# Patient Record
Sex: Female | Born: 1965 | Race: White | Hispanic: No | Marital: Married
Health system: Southern US, Community
[De-identification: ages and names within clinical notes are randomized; demographics above are authoritative.]

---

## 2005-05-15 ENCOUNTER — Ambulatory Visit: Payer: Self-pay | Admitting: Obstetrics and Gynecology

## 2006-03-02 ENCOUNTER — Observation Stay: Payer: Self-pay

## 2006-03-03 ENCOUNTER — Inpatient Hospital Stay: Payer: Self-pay

## 2007-09-30 ENCOUNTER — Ambulatory Visit: Payer: Self-pay

## 2012-10-31 ENCOUNTER — Ambulatory Visit: Payer: Self-pay

## 2012-11-11 ENCOUNTER — Ambulatory Visit: Payer: Self-pay

## 2013-12-14 IMAGING — MG MM ADDITIONAL VIEWS AT NO CHARGE
1 series · 2 of 2 positions shown · non-contrast
Comparison: 10/31/2012 and earlier

CLINICAL DATA: The patient presents for further evaluation of the
left breast.

EXAM:
DIGITAL DIAGNOSTIC  left MAMMOGRAM WITH CAD
ULTRASOUND left BREAST

[L ML · left · 2 of 2 slices shown]
[im 1/2]
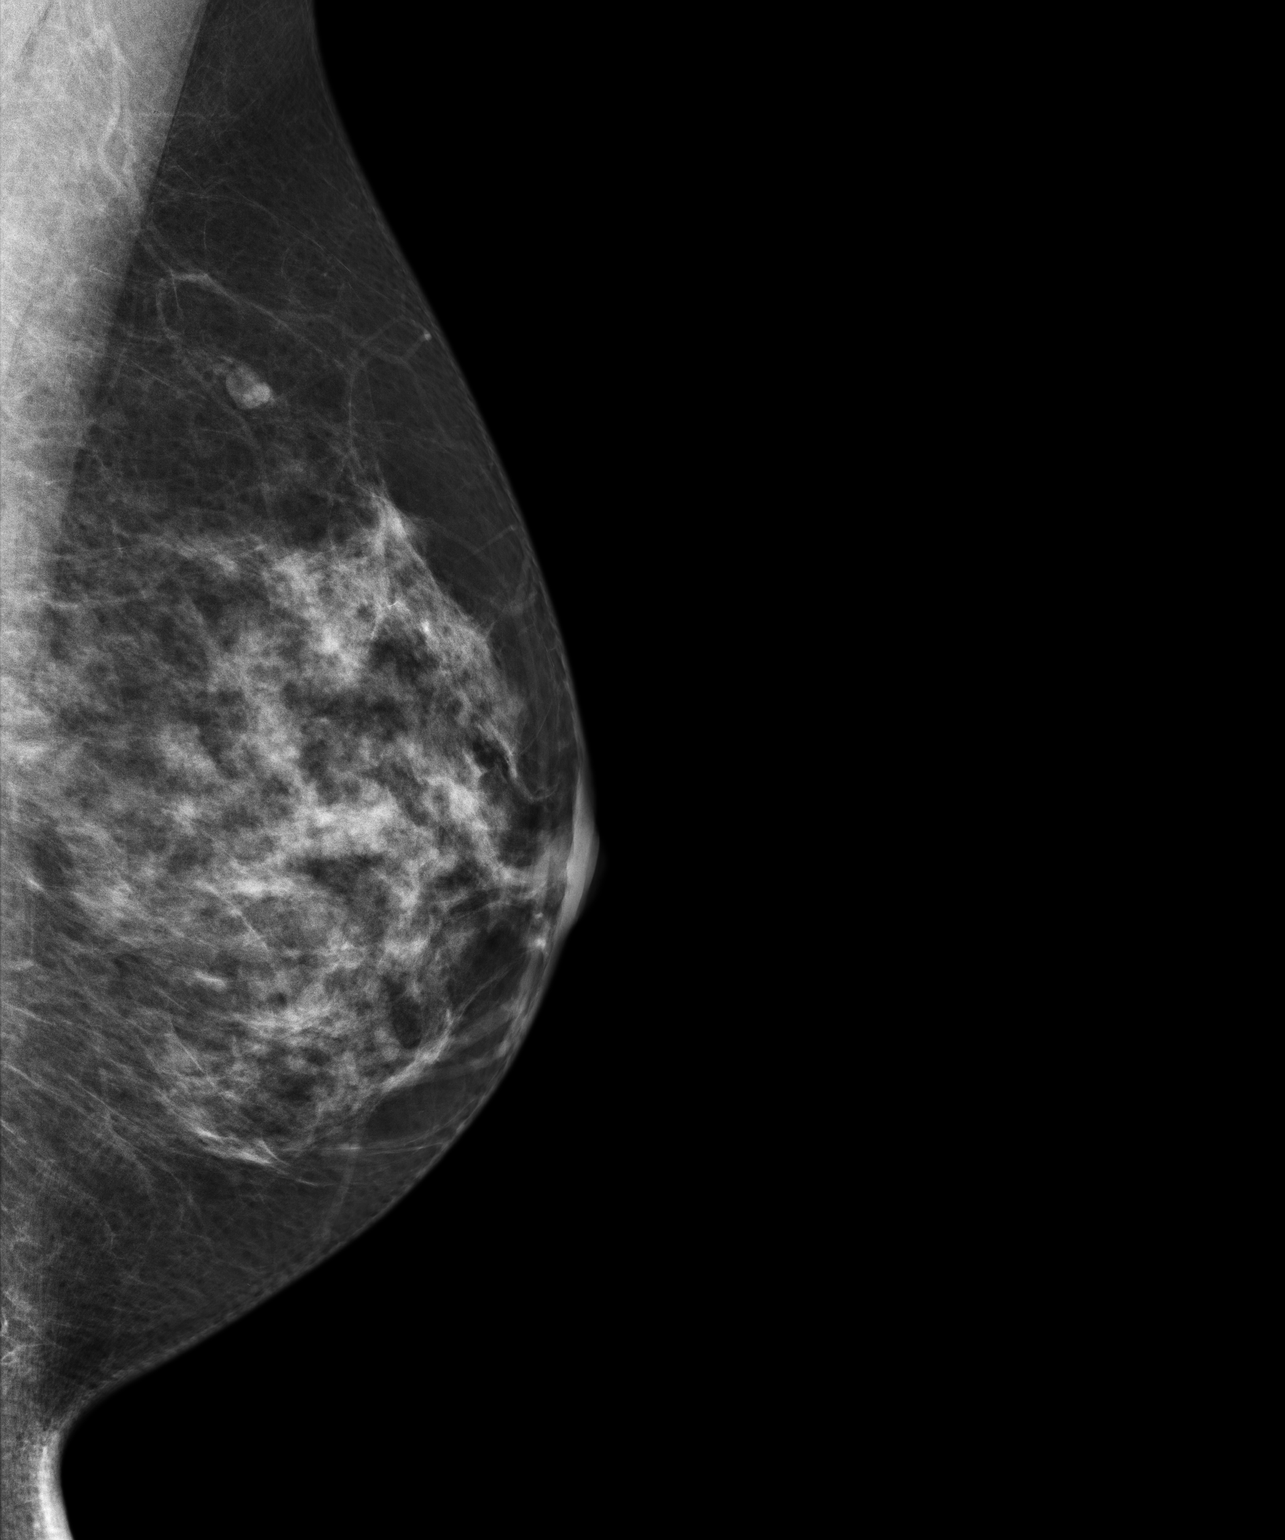
[im 2/2]
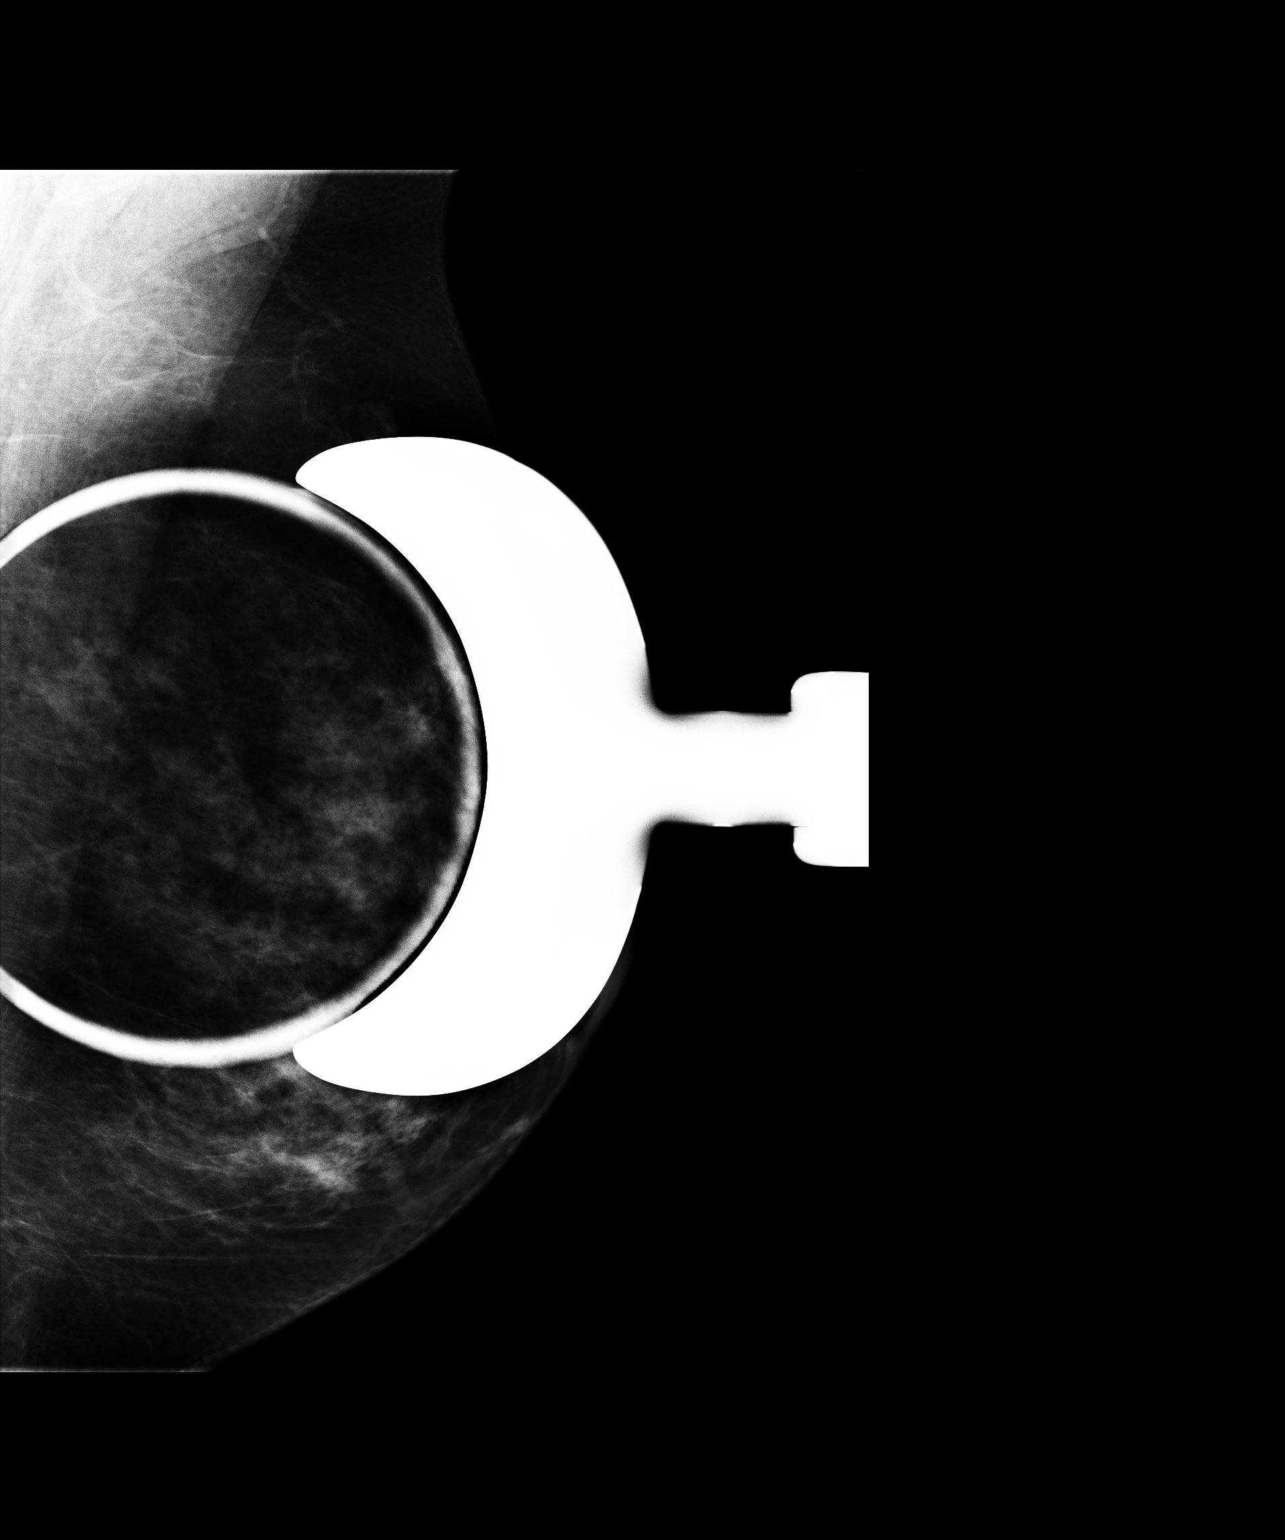

[2 of 2 positions shown; findings below may reference images not displayed]

ACR Breast Density Category c: The breasts are heterogeneously
dense, which may obscure small masses.
FINDINGS: Additional views are performed, showing no persistent abnormality in
the upper portion of the left breast.

Mammographic images were processed with CAD.

On physical exam,I palpate no abnormality in the upper quadrants of
the left breast..

Ultrasound is performed, showing normal appearing, dense
fibroglandular tissue throughout the upper quadrants on the left. No
mass, distortion, or acoustic shadowing is demonstrated with
ultrasound. .
IMPRESSION: 1.  No mammographic or ultrasound evidence for malignancy.
2. No persistent abnormality in the left breast on further
evaluation.

RECOMMENDATION:
Screening mammogram in one year.(Code:[id number]-Q-7T9)

I have discussed the findings and recommendations with the patient.
Results were also provided in writing at the conclusion of the
visit.

BI-RADS CATEGORY  1: Negative

## 2017-11-17 ENCOUNTER — Ambulatory Visit
Admission: RE | Admit: 2017-11-17 | Discharge: 2017-11-17 | Disposition: A | Discharge status: Home / Self Care | Payer: Self-pay | Source: Ambulatory Visit | Attending: Oncology | Admitting: Oncology

## 2017-11-17 ENCOUNTER — Encounter: Payer: Self-pay | Admitting: *Deleted

## 2017-11-17 ENCOUNTER — Ambulatory Visit: Payer: Self-pay | Attending: Oncology | Admitting: *Deleted

## 2017-11-17 ENCOUNTER — Other Ambulatory Visit: Payer: Self-pay

## 2017-11-17 VITALS — BP 139/95 | HR 72 | Temp 98.1°F | Ht 66.0 in | Wt 174.0 lb

## 2017-11-17 DIAGNOSIS — Z0000 Encounter for general adult medical examination without abnormal findings: Secondary | ICD-10-CM

## 2017-11-17 NOTE — Progress Notes (Signed)
  Subjective:     Patient ID: Andrea Johnson, female   DOB: 1965-10-12, 52 y.o.   MRN: [id number]  HPI   Review of Systems     Objective:   Physical Exam  Pulmonary/Chest: Right breast exhibits no inverted nipple, no mass, no nipple discharge, no skin change and no tenderness. Left breast exhibits no inverted nipple, no mass, no nipple discharge, no skin change and no tenderness.  Abdominal: There is no splenomegaly or hepatomegaly.  Genitourinary: No labial fusion. There is no rash, tenderness, lesion or injury on the right labia. There is no rash, tenderness, lesion or injury on the left labia. Cervix exhibits friability. Cervix exhibits no motion tenderness and no discharge. Right adnexum displays no mass, no tenderness and no fullness. Left adnexum displays no mass, no tenderness and no fullness. No erythema, tenderness or bleeding in the vagina. No foreign body in the vagina. No signs of injury around the vagina. No vaginal discharge found.         Assessment:     52 year old White female referred to [facility] exam, mammogram and pap smear.  Clinical breast exam unremarkable.  Taught self breast awareness.  Specimen collected for pap smear without difficulty.  Patient has been screened for eligibility.  She does not have any insurance, Medicare or Medicaid.  She also meets financial eligibility.  Hand-out given on the Affordable Care Act.  Risk Assessment    Risk Scores      11/17/2017   Last edited by: Scarlett PrestoShaver, Anne F, RN   5-year risk: 1.1 %   Lifetime risk: 9.9 %            Plan:     Screening mammogram ordered.  Specimen for pap sent to the lab.  Will follow-up per [facility] protocol.

## 2017-11-17 NOTE — Patient Instructions (Signed)
Gave patient hand-out, Women Staying Healthy, Active and Well from [facility], with education on breast health, pap smears, heart and colon health.HPV Test The human papillomavirus (HPV) test is used to look for high-risk types of HPV infection. HPV is a group of about 100 viruses. Many of these viruses cause growths on, in, or around the genitals. Most HPV viruses cause infections that usually go away without treatment. However, HPV types 6, 11, 16, and 18 are considered high-risk types of HPV that can increase your risk of cancer of the cervix or anus if the infection is left untreated. An HPV test identifies the DNA (genetic) strands of the HPV infection, so it is also referred to as the HPV DNA test. Although HPV is found in both males and females, the HPV test is only used to screen for increased cancer risk in females:  With an abnormal Pap test.  After treatment of an abnormal Pap test.  Between the ages of 530 and 2265.  After treatment of a high-risk HPV infection.  The HPV test may be done at the same time as a pelvic exam and Pap test in females over the age of 50. Both the HPV test and Pap test require a sample of cells from the cervix. How do I prepare for this test?  Do not douche or take a bath for 24-48 hours before the test or as directed by your health care provider.  Do not have sex for 24-48 hours before the test or as directed by your health care provider.  You may be asked to reschedule the test if you are menstruating.  You will be asked to urinate before the test. What do the results mean? It is your responsibility to obtain your test results. Ask the lab or department performing the test when and how you will get your results. Talk with your health care provider if you have any questions about your results. Your result will be negative or positive. Meaning of Negative Test Results A negative HPV test result means that no HPV was found, and it is very likely that you do not  have HPV. Meaning of Positive Test Results A positive HPV test result indicates that you have HPV.  If your test result shows the presence of any high-risk HPV strains, you may have an increased risk of developing cancer of the cervix or anus if the infection is left untreated.  If any low-risk HPV strains are found, you are not likely to have an increased risk of cancer.  Discuss your test results with your health care provider. He or she will use the results to make a diagnosis and determine a treatment plan that is right for you. Talk with your health care provider to discuss your results, treatment options, and if necessary, the need for more tests. Talk with your health care provider if you have any questions about your results. This information is not intended to replace advice given to you by your health care provider. Make sure you discuss any questions you have with your health care provider. Document Released: 01/17/2004 Document Revised: 08/28/2015 Document Reviewed: 05/09/2013 Elsevier Interactive Patient Education  2018 [employer]

## 2017-11-20 LAB — PAP LB AND HPV HIGH-RISK: HPV, HIGH-RISK: NEGATIVE

## 2017-11-22 ENCOUNTER — Encounter: Payer: Self-pay | Admitting: *Deleted

## 2017-11-22 NOTE — Progress Notes (Unsigned)
Letter mailed to inform patient of her normal mammogram and pap smear.  Next mammo due in one year and pap smear due in 5 years.  [facility]IS to Christy. 

## 2018-12-20 IMAGING — MG DIGITAL SCREENING BILATERAL MAMMOGRAM WITH TOMO AND CAD
8 series · 8 of 24 positions shown · non-contrast
Comparison: Previous exam(s).

CLINICAL DATA: Screening.

EXAM:
DIGITAL SCREENING BILATERAL MAMMOGRAM WITH TOMO AND CAD

[L MLO synth-2D]
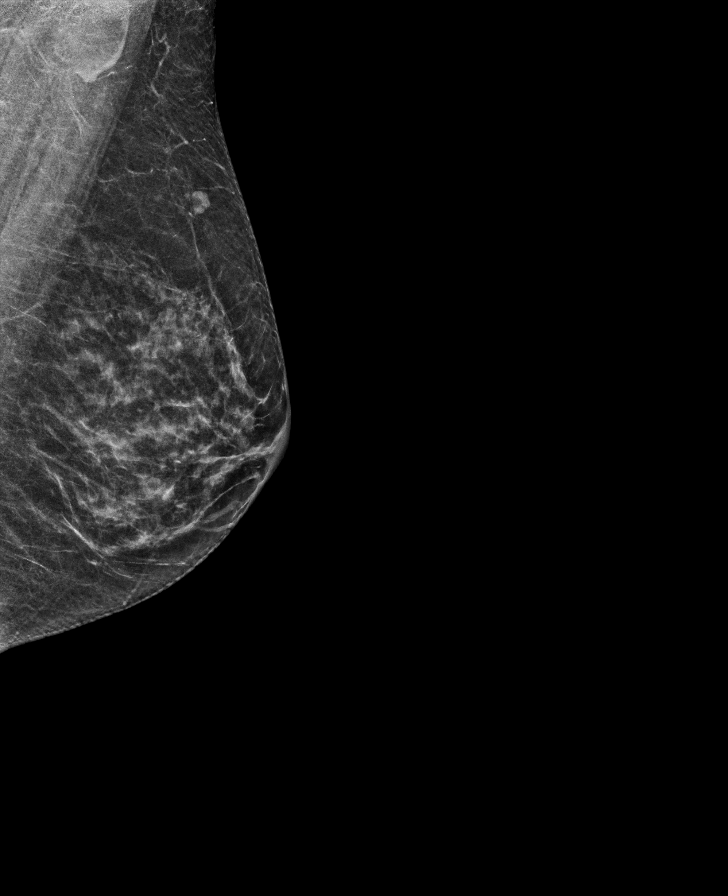

[R MLO synth-2D]
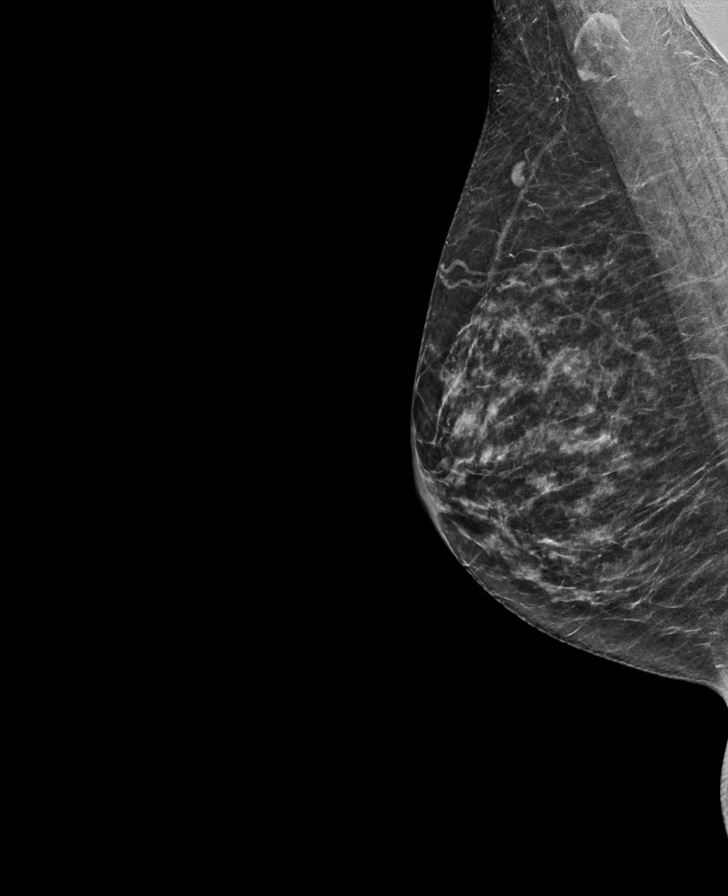

[R CC synth-2D]
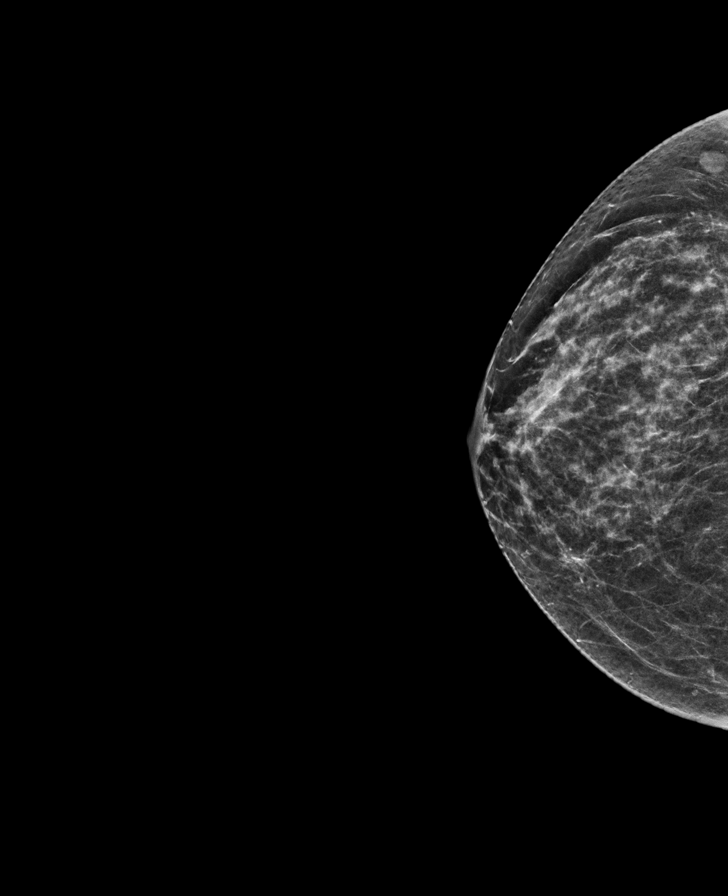

[L CC synth-2D]
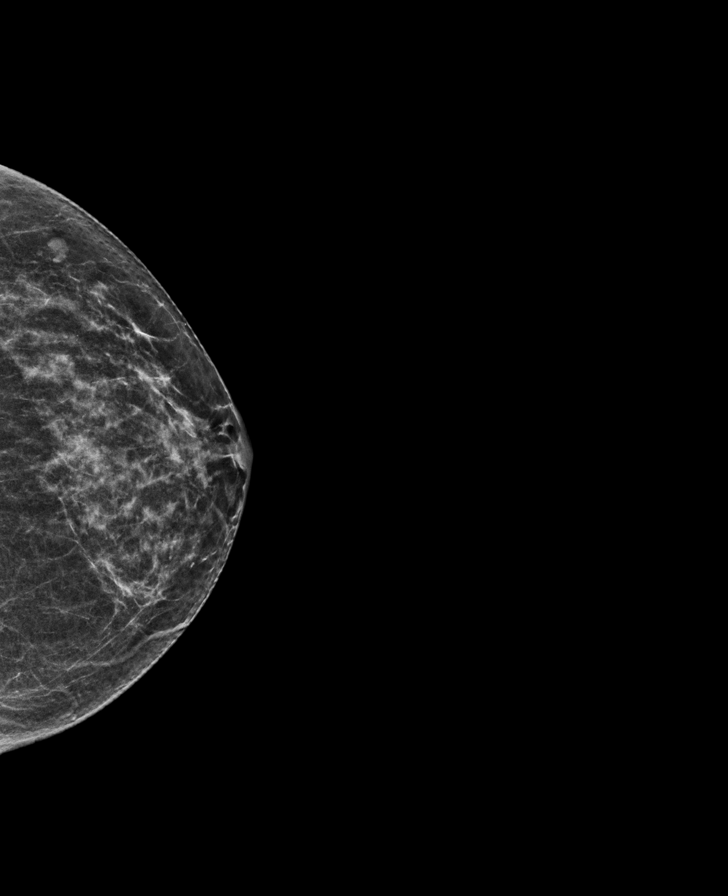

[R CC tomo · tomo slice 27/53.0]
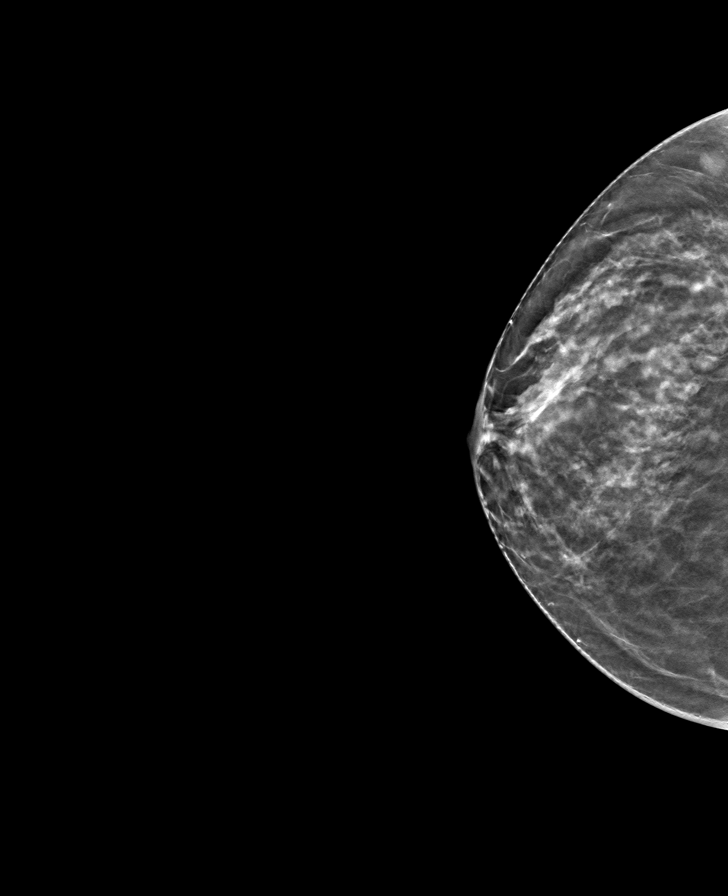

[L CC tomo · tomo slice 27/52.0]
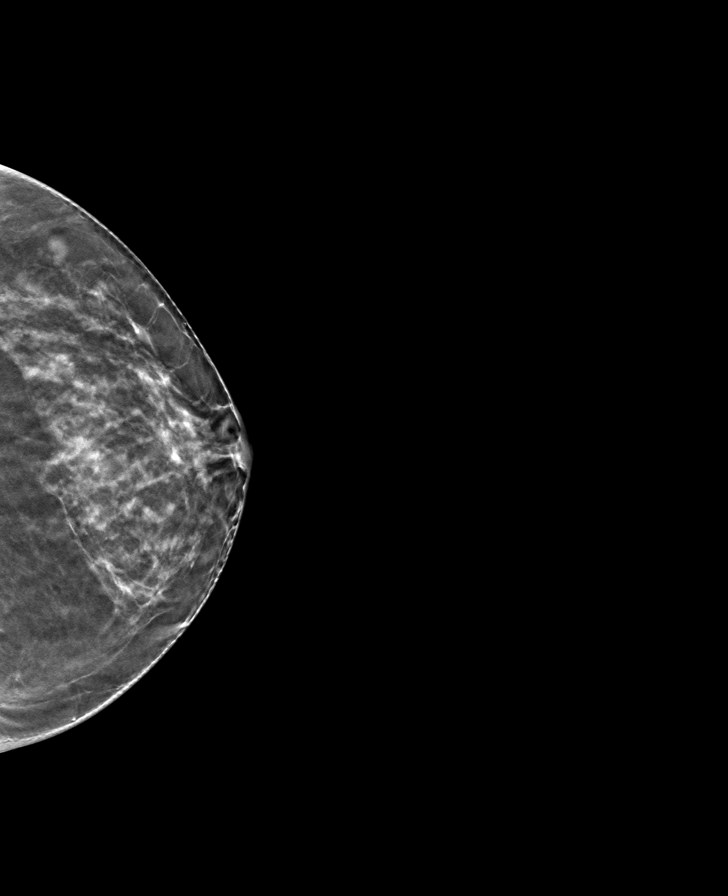

[R MLO tomo · tomo slice 27/54.0]
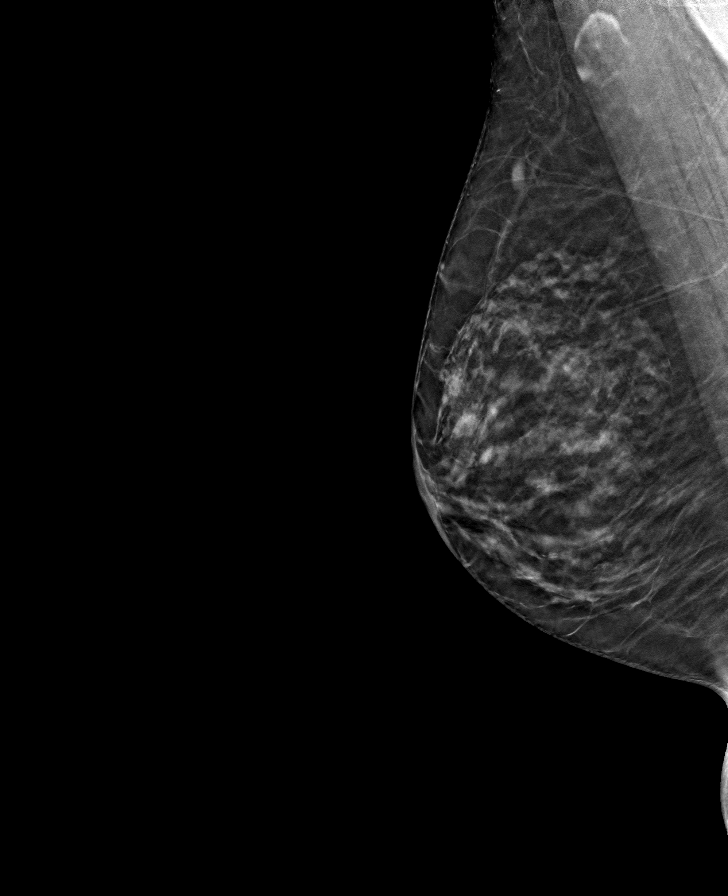

[L MLO tomo · tomo slice 27/53.0]
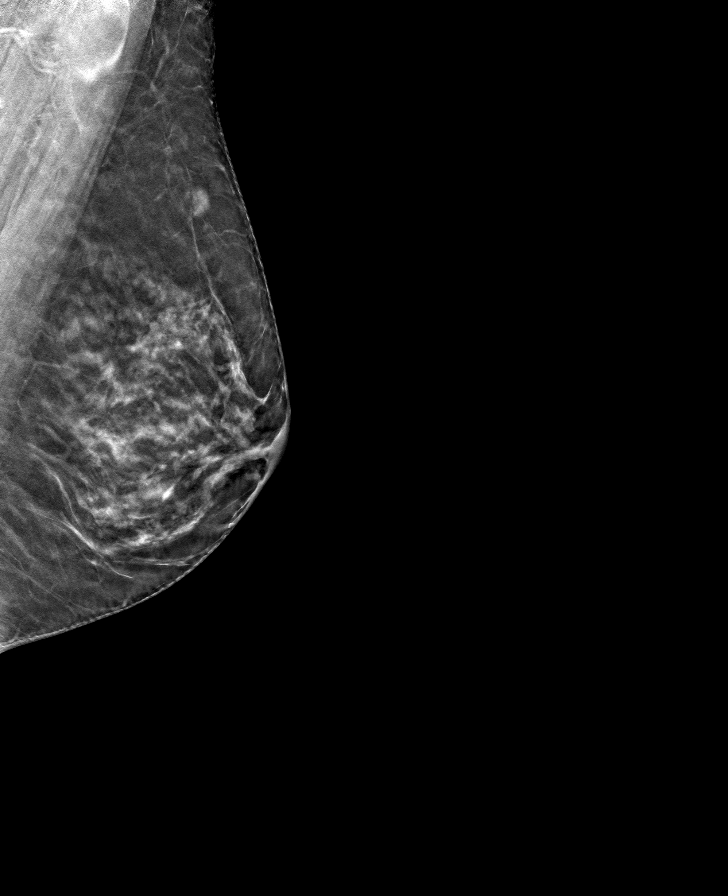

[8 of 24 positions shown; findings below may reference images not displayed]

ACR Breast Density Category c: The breast tissue is heterogeneously
dense, which may obscure small masses.
FINDINGS: There are no findings suspicious for malignancy. Images were
processed with CAD.
IMPRESSION: No mammographic evidence of malignancy. A result letter of this
screening mammogram will be mailed directly to the patient.

RECOMMENDATION:
Screening mammogram in one year. (Code:[id number])

BI-RADS CATEGORY  1: Negative.
